# Patient Record
Sex: Male | Born: 1975 | Race: White | Hispanic: No | Marital: Single
Health system: Southern US, Community
[De-identification: ages and names within clinical notes are randomized; demographics above are authoritative.]

## PROBLEM LIST (undated history)

## (undated) DIAGNOSIS — C8190 Hodgkin lymphoma, unspecified, unspecified site: Secondary | ICD-10-CM

---

## 2013-10-19 ENCOUNTER — Emergency Department (HOSPITAL_COMMUNITY)
Admission: EM | Admit: 2013-10-19 | Discharge: 2013-10-20 | Discharge status: Against Medical Advice | Payer: Self-pay | Attending: Emergency Medicine | Admitting: Emergency Medicine

## 2013-10-19 ENCOUNTER — Encounter (HOSPITAL_COMMUNITY): Payer: Self-pay | Admitting: Emergency Medicine

## 2013-10-19 ENCOUNTER — Emergency Department (HOSPITAL_COMMUNITY): Payer: Self-pay

## 2013-10-19 DIAGNOSIS — S6990XA Unspecified injury of unspecified wrist, hand and finger(s), initial encounter: Principal | ICD-10-CM

## 2013-10-19 DIAGNOSIS — Z880 Allergy status to penicillin: Secondary | ICD-10-CM | POA: Insufficient documentation

## 2013-10-19 DIAGNOSIS — S59909A Unspecified injury of unspecified elbow, initial encounter: Secondary | ICD-10-CM | POA: Insufficient documentation

## 2013-10-19 DIAGNOSIS — Y92410 Unspecified street and highway as the place of occurrence of the external cause: Secondary | ICD-10-CM | POA: Insufficient documentation

## 2013-10-19 DIAGNOSIS — W010XXA Fall on same level from slipping, tripping and stumbling without subsequent striking against object, initial encounter: Secondary | ICD-10-CM | POA: Insufficient documentation

## 2013-10-19 DIAGNOSIS — Z8571 Personal history of Hodgkin lymphoma: Secondary | ICD-10-CM | POA: Insufficient documentation

## 2013-10-19 DIAGNOSIS — S59919A Unspecified injury of unspecified forearm, initial encounter: Principal | ICD-10-CM

## 2013-10-19 DIAGNOSIS — Y9389 Activity, other specified: Secondary | ICD-10-CM | POA: Insufficient documentation

## 2013-10-19 HISTORY — DX: Hodgkin lymphoma, unspecified, unspecified site: C81.90

## 2013-10-19 MED ORDER — TETANUS-DIPHTH-ACELL PERTUSSIS 5-2.5-18.5 LF-MCG/0.5 IM SUSP
0.5000 mL | Freq: Once | INTRAMUSCULAR | Status: DC
  Filled 2013-10-19: qty 0.5
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
## 2013-10-20 NOTE — ED Notes (Signed)
Pt became belligerent and began shouting and cursing at staff. Refused medications and left AMA. Ambulatory. Escorted out by [employer].
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
## 2013-10-20 NOTE — ED Provider Notes (Signed)
Medical screening examination/treatment/procedure(s) were performed by non-physician practitioner and as supervising physician I was immediately available for consultation/collaboration.   EKG Interpretation None       Varney Biles, MD 10/20/13 [id number]
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
